# Patient Record
Sex: Male | Born: 1995 | Race: Black or African American | Hispanic: No | Marital: Single | Smoking: Never smoker
Health system: Southern US, Community
[De-identification: ages and names within clinical notes are randomized; demographics above are authoritative.]

---

## 2018-11-14 ENCOUNTER — Other Ambulatory Visit: Payer: Self-pay

## 2018-11-14 ENCOUNTER — Emergency Department (HOSPITAL_COMMUNITY)
Admission: EM | Admit: 2018-11-14 | Discharge: 2018-11-14 | Disposition: A | Discharge status: Home / Self Care | Payer: Self-pay | Attending: Emergency Medicine | Admitting: Emergency Medicine

## 2018-11-14 ENCOUNTER — Ambulatory Visit (HOSPITAL_COMMUNITY)
Admission: RE | Admit: 2018-11-14 | Discharge: 2018-11-14 | Disposition: A | Discharge status: Home / Self Care | Payer: Self-pay | Attending: Psychiatry | Admitting: Psychiatry

## 2018-11-14 ENCOUNTER — Emergency Department (HOSPITAL_COMMUNITY): Payer: Self-pay

## 2018-11-14 DIAGNOSIS — Z590 Homelessness: Secondary | ICD-10-CM | POA: Insufficient documentation

## 2018-11-14 DIAGNOSIS — R05 Cough: Secondary | ICD-10-CM | POA: Insufficient documentation

## 2018-11-14 DIAGNOSIS — F329 Major depressive disorder, single episode, unspecified: Secondary | ICD-10-CM | POA: Insufficient documentation

## 2018-11-14 DIAGNOSIS — F251 Schizoaffective disorder, depressive type: Secondary | ICD-10-CM | POA: Insufficient documentation

## 2018-11-14 DIAGNOSIS — R0602 Shortness of breath: Secondary | ICD-10-CM | POA: Insufficient documentation

## 2018-11-14 DIAGNOSIS — Z79899 Other long term (current) drug therapy: Secondary | ICD-10-CM | POA: Insufficient documentation

## 2018-11-14 DIAGNOSIS — R059 Cough, unspecified: Secondary | ICD-10-CM

## 2018-11-14 DIAGNOSIS — R45851 Suicidal ideations: Secondary | ICD-10-CM | POA: Insufficient documentation

## 2018-11-14 NOTE — H&P (Signed)
Behavioral Health Medical Screening Exam  Christian Bush is an 23 y.o. male. Pt presented to [facility] as a voluntary walk-in. He was seen by this provider and Dr Lucianne Muss.  He stated he is hearing voices but would not elaborate on what he is hearing. He stated he takes Seroquel but would not state who prescribes it for him. He also stated he lives with his cousin yet he appears to be homeless. He is asking for food and wants to sleep. Pt does not meet inpatient criteria. Pt was provided with outpatient resources for medication management and therapy and shelter resources. Pt is psychiatrically clear.   Total Time spent with patient: 30 minutes  Psychiatric Specialty Exam: Physical Exam  ROS  There were no vitals taken for this visit.There is no height or weight on file to calculate BMI.  General Appearance: Disheveled  Eye Contact:  Poor  Speech:  Clear and Coherent and Slow  Volume:  Decreased  Mood:  Depressed and Dysphoric  Affect:  Congruent and Depressed  Thought Process:  Coherent, Linear and Descriptions of Associations: Intact  Orientation:  Full (Time, Place, and Person)  Thought Content:  Logical  Suicidal Thoughts:  No  Homicidal Thoughts:  No  Memory:  Immediate;   Good Recent;   Fair Remote;   Fair  Judgement:  Fair  Insight:  Fair  Psychomotor Activity:  Normal  Concentration: Concentration: Good and Attention Span: Good  Recall:  Good  Fund of Knowledge:Good  Language: Good  Akathisia:  Negative  Handed:  Right  AIMS (if indicated):     Assets:  Communication Skills Physical Health Resilience  Sleep:       Musculoskeletal: Strength & Muscle Tone: within normal limits Gait & Station: normal Patient leans: N/A  There were no vitals taken for this visit.  Recommendations:  Based on my evaluation the patient does not appear to have an emergency medical condition.  Laveda Abbe, NP 11/14/2018, 4:20 PM

## 2018-11-14 NOTE — ED Provider Notes (Signed)
[facility]-EMERGENCY DEPT Provider Note   CSN: [id number] Arrival date & time: 11/14/18  2126    History   Chief Complaint Chief Complaint  Patient presents with  . Suicidal    HPI Christian Bush is a 23 y.o. male.     The history is provided by the patient.  Mental Health Problem  Presenting symptoms: suicidal thoughts   Patient accompanied by:  Caregiver Degree of incapacity (severity):  Mild Onset quality:  Gradual Timing:  Intermittent Progression:  Waxing and waning Chronicity:  Chronic Context: noncompliance   Relieved by:  Nothing Worsened by:  Nothing Associated symptoms: no abdominal pain, no chest pain and no poor judgment   Risk factors: hx of mental illness     No past medical history on file.  There are no active problems to display for this patient.        Home Medications    Prior to Admission medications   Not on File    Family History No family history on file.  Social History Social History   Tobacco Use  . Smoking status: Not on file  Substance Use Topics  . Alcohol use: Not on file  . Drug use: Not on file     Allergies   Patient has no known allergies.   Review of Systems Review of Systems  Constitutional: Positive for fever. Negative for chills.  HENT: Negative for ear pain and sore throat.   Eyes: Negative for pain and visual disturbance.  Respiratory: Positive for shortness of breath. Negative for cough.   Cardiovascular: Negative for chest pain and palpitations.  Gastrointestinal: Negative for abdominal pain and vomiting.  Genitourinary: Negative for dysuria and hematuria.  Musculoskeletal: Negative for arthralgias and back pain.  Skin: Negative for color change and rash.  Neurological: Negative for seizures and syncope.  Psychiatric/Behavioral: Positive for suicidal ideas.  All other systems reviewed and are negative.    Physical Exam Updated Vital Signs  ED Triage Vitals [11/14/18  2136]  Enc Vitals Group     BP (!) 146/84     Pulse Rate (!) 105     Resp 18     Temp 98.4 F (36.9 C)     Temp Source Oral     SpO2 98 %     Weight      Height      Head Circumference      Peak Flow      Pain Score      Pain Loc      Pain Edu?      Excl. in GC?     Physical Exam Vitals signs and nursing note reviewed.  Constitutional:      General: He is not in acute distress.    Appearance: He is well-developed. He is not ill-appearing.  HENT:     Head: Normocephalic and atraumatic.     Nose: Nose normal.     Mouth/Throat:     Mouth: Mucous membranes are moist.  Eyes:     Extraocular Movements: Extraocular movements intact.     Conjunctiva/sclera: Conjunctivae normal.     Pupils: Pupils are equal, round, and reactive to light.  Neck:     Musculoskeletal: Normal range of motion and neck supple.  Cardiovascular:     Rate and Rhythm: Normal rate and regular rhythm.     Pulses: Normal pulses.     Heart sounds: Normal heart sounds. No murmur.  Pulmonary:     Effort: Pulmonary effort  is normal. No respiratory distress.     Breath sounds: Normal breath sounds.  Abdominal:     General: There is no distension.     Palpations: Abdomen is soft.     Tenderness: There is no abdominal tenderness.  Musculoskeletal: Normal range of motion.  Skin:    General: Skin is warm and dry.     Capillary Refill: Capillary refill takes less than 2 seconds.  Neurological:     General: No focal deficit present.     Mental Status: He is alert.  Psychiatric:        Mood and Affect: Mood normal.      ED Treatments / Results  Labs (all labs ordered are listed, but only abnormal results are displayed) Labs Reviewed - No data to display  EKG None  Radiology Dg Chest Portable 1 View  Result Date: 11/14/2018 CLINICAL DATA:  Fever and shortness of breath EXAM: PORTABLE CHEST 1 VIEW COMPARISON:  None. FINDINGS: The heart size and mediastinal contours are within normal limits. Both  lungs are clear. The visualized skeletal structures are unremarkable. IMPRESSION: No active disease. Electronically Signed   By: Deatra Robinson M.D.   On: 11/14/2018 22:05    Procedures Procedures (including critical care time)  Medications Ordered in ED Medications - No data to display   Initial Impression / Assessment and Plan / ED Course  I have reviewed the triage vital signs and the nursing notes.  Pertinent labs & imaging results that were available during my care of the patient were reviewed by me and considered in my medical decision making (see chart for details).     [name] is a 23 year old male who presents to the ED with shortness of breath, fever.  Patient with unremarkable vitals.  No fever upon arrival.  Patient has had cough and fever supposedly.  Recently moved here from [place].  Patient currently homeless.  Patient states that he is having suicidal thoughts today and increase hallucinations.  Patient was actually seen by behavioral health just several hours prior to his arrival to the ED.  BH note states that patient can follow-up outpatient.  There did not appear to be any need for admission per their note.  Patient does not have a plan.  I believe his visit today primarily due to homelessness.  According to behavioral health note from 3 hours ago they state that he did not meet inpatient criteria.  He was given resources for outpatient follow-up and for homeless shelters. Called social work and they recommend, [facility] follow up. Patient with unremarkable CXR. Suspect patient was unable to get into a homeless shelter today and primary reason for his visit. Patient had food while in the, unfortunately have to d/c patient at this time. He understands resources to follow up with [facility] and [facility]. Patient discharged in good condition.   This chart was dictated using voice recognition software.  Despite best efforts to proofread,  errors can occur which can change the  documentation meaning.   Final Clinical Impressions(s) / ED Diagnoses   Final diagnoses:  Suicidal ideation  Cough    ED Discharge Orders    None       Virgina Norfolk, DO 11/14/18 2229

## 2018-11-14 NOTE — ED Triage Notes (Signed)
Pt is here after moving from [place], pt is currently homeless. Hx of asthma ans behavioral health hx. Just got discharged from [facility] 2 hours ago. Reports fever and dry cough. Afebrile here

## 2018-11-14 NOTE — ED Notes (Signed)
Pt given and verbalized understanding of d/c instructions and need for follow up with pcp and [facility]. Told to return if s/s worsen. No further distress or questions upon ambulation with security out of department. Pt refused to sign as he walked out and stated "Im' not signing a damn thing"

## 2018-11-14 NOTE — BH Assessment (Addendum)
Assessment Note  Christian Bush is an 23 y.o. male.  The pt came in due to having suicidal thoughts and visual and audial hallucinations. The pt denies having a suicidal plan.  He stated he is seeing and hearing spirits.  When asked what he is hearing, the pt stated he doesn't like what they are saying and wouldn't give details about what he is seeing and hearing. The pt is evasive.  He stated his birthday as March 30, 1996 and stated he is [age], which isn't mathematically possible.  He was asked this several times.  He denies having any identification.  He denies any past psychiatric history, but later stated he has taken seroquel in the past.  The pt stated he lives with a cousin and he couldn't provide the address of the cousin.  It is believed that the pt is homeless.  He denies self harm, legal issues, and history of abuse.  He stated he is sleeping and eating well.  The pt denies SA.  Pt is dressed in casual clohtes. He is alert and oriented x4. Pt speaks in a clear tone, at low volume and normal pace. Eye contact is fair. Pt's mood is irritated. Thought process is coherent and relevant.           Diagnosis: F25.1 Schizoaffective disorder, Depressive type  Past Medical History: No past medical history on file.   Family History: No family history on file.  Social History:  has no history on file for tobacco, alcohol, and drug.  Additional Social History:  Alcohol / Drug Use Pain Medications: See MAR Prescriptions: See MAR Over the Counter: See MAR History of alcohol / drug use?: No history of alcohol / drug abuse Longest period of sobriety (when/how long): NA  CIWA:   COWS:    Allergies: Allergies not on file  Home Medications: (Not in a hospital admission)   OB/GYN Status:  No LMP for male patient.  General Assessment Data [facility] TTS Assessment: In system Is this a Tele or Face-to-Face Assessment?: Face-to-Face Is this an Initial  Assessment or a Re-assessment for this encounter?: Initial Assessment Patient Accompanied by:: N/A Language Other than English: No Living Arrangements: Homeless/Shelter What gender do you identify as?: Male Marital status: Single Living Arrangements: Other (Comment)(homeless) Can pt return to current living arrangement?: Yes Admission Status: Voluntary Is patient capable of signing voluntary admission?: Yes Referral Source: Self/Family/Friend Insurance type: Self Pay     Crisis Care Plan Living Arrangements: Other (Comment)(homeless) Legal Guardian: Other:(Self) Name of Psychiatrist: none Name of Therapist: none  Education Status Is patient currently in school?: No Is the patient employed, unemployed or receiving disability?: Unemployed  Risk to self with the past 6 months Suicidal Ideation: Yes-Currently Present Has patient been a risk to self within the past 6 months prior to admission? : No Suicidal Intent: No Has patient had any suicidal intent within the past 6 months prior to admission? : No Is patient at risk for suicide?: No Suicidal Plan?: No Has patient had any suicidal plan within the past 6 months prior to admission? : No Access to Means: No What has been your use of drugs/alcohol within the last 12 months?: none Previous Attempts/Gestures: No How many times?: 0 Other Self Harm Risks: none Triggers for Past Attempts: None known Intentional Self Injurious Behavior: None Family Suicide History: No Recent stressful life event(s): Other (Comment)(pt denies any stressors) Persecutory voices/beliefs?: No Substance abuse history and/or treatment for substance abuse?:  No Suicide prevention information given to non-admitted patients: Yes  Risk to Others within the past 6 months Homicidal Ideation: No Does patient have any lifetime risk of violence toward others beyond the six months prior to admission? : No Thoughts of Harm to Others: No Current Homicidal Intent:  No Current Homicidal Plan: No Access to Homicidal Means: No Identified Victim: pt denies History of harm to others?: No Assessment of Violence: None Noted Violent Behavior Description: pt denies Does patient have access to weapons?: No Criminal Charges Pending?: No Does patient have a court date: No Is patient on probation?: No  Psychosis Hallucinations: None noted Delusions: None noted  Mental Status Report Appearance/Hygiene: Unremarkable Eye Contact: Fair Motor Activity: Freedom of movement Speech: Logical/coherent, Soft Level of Consciousness: Alert Mood: Depressed Affect: Depressed Anxiety Level: None Thought Processes: Thought Blocking Judgement: Impaired Orientation: Person, Place, Time, Situation Obsessive Compulsive Thoughts/Behaviors: None  Cognitive Functioning Concentration: Normal Memory: Recent Intact, Remote Intact Is patient IDD: No Insight: Fair Impulse Control: Poor Appetite: Good Have you had any weight changes? : No Change Sleep: No Change Total Hours of Sleep: 8 Vegetative Symptoms: None  ADLScreening [facility] Assessment Services) Patient's cognitive ability adequate to safely complete daily activities?: Yes Patient able to express need for assistance with ADLs?: Yes Independently performs ADLs?: Yes (appropriate for developmental age)  Prior Inpatient Therapy Prior Inpatient Therapy: No  Prior Outpatient Therapy Prior Outpatient Therapy: No Does patient have an ACCT team?: No Does patient have Intensive In-House Services?  : No Does patient have [facility] services? : No Does patient have [facility] services?: No  ADL Screening (condition at time of admission) Patient's cognitive ability adequate to safely complete daily activities?: Yes Patient able to express need for assistance with ADLs?: Yes Independently performs ADLs?: Yes (appropriate for developmental age)       Abuse/Neglect Assessment (Assessment to be complete while patient is  alone) Abuse/Neglect Assessment Can Be Completed: Yes Physical Abuse: Denies Verbal Abuse: Denies Sexual Abuse: Denies Exploitation of patient/patient's resources: Denies Self-Neglect: Denies Values / Beliefs Cultural Requests During Hospitalization: None Spiritual Requests During Hospitalization: None Consults Spiritual Care Consult Needed: No Social Work Consult Needed: No            Disposition:  NP Elta Guadeloupe recommends the pt be discharged and to follow up with [facility].    On Site Evaluation by:   Reviewed with Physician:    Ottis Stain 11/14/2018 4:15 PM

## 2018-11-14 NOTE — ED Notes (Signed)
Bed: AX09 Expected date:  Expected time:  Means of arrival:  Comments: EMS SOB, cough

## 2018-12-09 ENCOUNTER — Emergency Department (HOSPITAL_COMMUNITY)
Admission: EM | Admit: 2018-12-09 | Discharge: 2018-12-10 | Disposition: A | Discharge status: Other Acute Inpatient Hospital | Payer: Self-pay | Attending: Emergency Medicine | Admitting: Emergency Medicine

## 2018-12-09 ENCOUNTER — Encounter (HOSPITAL_COMMUNITY): Payer: Self-pay

## 2018-12-09 ENCOUNTER — Other Ambulatory Visit: Payer: Self-pay

## 2018-12-09 DIAGNOSIS — R45851 Suicidal ideations: Secondary | ICD-10-CM | POA: Insufficient documentation

## 2018-12-09 DIAGNOSIS — F319 Bipolar disorder, unspecified: Secondary | ICD-10-CM | POA: Insufficient documentation

## 2018-12-09 DIAGNOSIS — Z008 Encounter for other general examination: Secondary | ICD-10-CM | POA: Insufficient documentation

## 2018-12-09 LAB — COMPREHENSIVE METABOLIC PANEL
ALT: 42 U/L (ref 0–44)
AST: 25 U/L (ref 15–41)
Albumin: 4.4 g/dL (ref 3.5–5.0)
Alkaline Phosphatase: 78 U/L (ref 38–126)
Anion gap: 8 (ref 5–15)
BUN: 18 mg/dL (ref 6–20)
CO2: 22 mmol/L (ref 22–32)
Calcium: 9.1 mg/dL (ref 8.9–10.3)
Chloride: 106 mmol/L (ref 98–111)
Creatinine, Ser: 1.01 mg/dL (ref 0.61–1.24)
GFR calc Af Amer: >60 mL/min (ref >60)
GFR calc non Af Amer: >60 mL/min (ref >60)
Glucose, Bld: 90 mg/dL (ref 70–99)
Potassium: 3.7 mmol/L (ref 3.5–5.1)
Sodium: 136 mmol/L (ref 135–145)
Total Bilirubin: 0.3 mg/dL (ref 0.3–1.2)
Total Protein: 8.4 g/dL — ABNORMAL HIGH (ref 6.5–8.1)

## 2018-12-09 LAB — CBC
HCT: 50 % (ref 39.0–52.0)
Hemoglobin: 15.8 g/dL (ref 13.0–17.0)
MCH: 27.8 pg (ref 26.0–34.0)
MCHC: 31.6 g/dL (ref 30.0–36.0)
MCV: 88 fL (ref 80.0–100.0)
Platelets: 300 10*3/uL (ref 150–400)
RBC: 5.68 MIL/uL (ref 4.22–5.81)
RDW: 13.4 % (ref 11.5–15.5)
WBC: 12.9 10*3/uL — ABNORMAL HIGH (ref 4.0–10.5)
nRBC: 0 % (ref 0.0–0.2)

## 2018-12-09 LAB — ETHANOL: Alcohol, Ethyl (B): <10 mg/dL (ref <10)

## 2018-12-09 LAB — SALICYLATE LEVEL: Salicylate Lvl: <7 mg/dL (ref 2.8–30.0)

## 2018-12-09 LAB — ACETAMINOPHEN LEVEL: Acetaminophen (Tylenol), Serum: <10 ug/mL — ABNORMAL LOW (ref 10–30)

## 2018-12-09 MED ORDER — NICOTINE 21 MG/24HR TD PT24: 21.0000 mg | MEDICATED_PATCH | Freq: Every day | TRANSDERMAL | Status: DC

## 2018-12-09 MED ORDER — ACETAMINOPHEN 325 MG PO TABS: 650.0000 mg | ORAL_TABLET | ORAL | Status: DC | PRN

## 2018-12-09 MED ORDER — ONDANSETRON HCL 4 MG PO TABS: 4.0000 mg | ORAL_TABLET | Freq: Three times a day (TID) | ORAL | Status: DC | PRN

## 2018-12-09 MED ORDER — ALUM & MAG HYDROXIDE-SIMETH 200-200-20 MG/5ML PO SUSP: 30.0000 mL | Freq: Four times a day (QID) | ORAL | Status: DC | PRN

## 2018-12-09 NOTE — ED Notes (Signed)
Patient dressed out in purple scrubs 

## 2018-12-09 NOTE — ED Provider Notes (Signed)
[facility]-EMERGENCY DEPT Provider Note   CSN: [id number] Arrival date & time: 12/09/18  1814  History   Chief Complaint Chief Complaint  Patient presents with  . Suicidal   HPI Christian Bush is a 23 y.o. male with past medical history who presents for evaluation suicide ideation.  Patient states he is suicidal plans to slit his wrists or jump off a bridge.  Also admits to audio hallucinations telling him to harm himself. Has any alcohol or illicit drug use.  Patient is unwilling to tell me last time he was inpatient in the hospital however takes me that is been inpatient for suicidal plans previously.  Denies any increased stressors.  He did recently moved from [place].  Currently staying with his cousin.  He is not taking any medications, previously prescribed Seroquel.  Denies fever, chills, headache, nausea, vomiting, neck pain, neck stiffness, chest pain, shortness of breath, abdominal pain, ,dysuria.  Patient unwilling to provide any additional information.  History obtained from patient and past medical history.  No interpreter was used.     HPI  History reviewed. No pertinent past medical history.  There are no active problems to display for this patient.   History reviewed. No pertinent surgical history.      Home Medications    Prior to Admission medications   Not on File    Family History No family history on file.  Social History Social History   Tobacco Use  . Smoking status: Never Smoker  . Smokeless tobacco: Never Used  Substance Use Topics  . Alcohol use: Not on file  . Drug use: Not on file     Allergies   Patient has no known allergies.   Review of Systems Review of Systems  Constitutional: Negative.   HENT: Negative.   Respiratory: Negative.   Cardiovascular: Negative.   Genitourinary: Negative.   Musculoskeletal: Negative.   Skin: Negative.   Neurological: Negative.   Psychiatric/Behavioral: Positive for  hallucinations and suicidal ideas. Negative for agitation, confusion, self-injury and sleep disturbance. The patient is not nervous/anxious and is not hyperactive.   All other systems reviewed and are negative.  Physical Exam Updated Vital Signs BP (!) 139/96 (BP Location: Left Arm)   Pulse 67   Temp 98 F (36.7 C) (Oral)   Resp 18   SpO2 98%   Physical Exam Vitals signs and nursing note reviewed.  Constitutional:      General: He is not in acute distress.    Appearance: He is well-developed. He is not diaphoretic.  HENT:     Head: Atraumatic.  Eyes:     Pupils: Pupils are equal, round, and reactive to light.  Neck:     Musculoskeletal: Normal range of motion and neck supple.  Cardiovascular:     Rate and Rhythm: Normal rate and regular rhythm.     Pulses: Normal pulses.     Heart sounds: Normal heart sounds.  Pulmonary:     Effort: Pulmonary effort is normal. No respiratory distress.     Breath sounds: Normal breath sounds. No stridor. No wheezing, rhonchi or rales.  Abdominal:     General: Bowel sounds are normal. There is no distension.     Palpations: Abdomen is soft.     Tenderness: There is no abdominal tenderness. There is no guarding or rebound.  Musculoskeletal: Normal range of motion.  Skin:    General: Skin is warm and dry.  Neurological:     Mental Status: He is  alert.  Psychiatric:        Attention and Perception: He is attentive. He perceives auditory hallucinations. He does not perceive visual hallucinations.        Mood and Affect: Mood is depressed. Affect is flat.        Speech: Speech normal.        Behavior: Behavior is withdrawn.        Thought Content: Thought content is not paranoid or delusional. Thought content includes suicidal ideation. Thought content does not include homicidal ideation. Thought content includes suicidal plan. Thought content does not include homicidal plan.    ED Treatments / Results  Labs (all labs ordered are listed, but  only abnormal results are displayed) Labs Reviewed  COMPREHENSIVE METABOLIC PANEL - Abnormal; Notable for the following components:      Result Value   Total Protein 8.4 (*)    All other components within normal limits  ACETAMINOPHEN LEVEL - Abnormal; Notable for the following components:   Acetaminophen (Tylenol), Serum <10 (*)    All other components within normal limits  CBC - Abnormal; Notable for the following components:   WBC 12.9 (*)    All other components within normal limits  ETHANOL  SALICYLATE LEVEL  RAPID URINE DRUG SCREEN, HOSP PERFORMED    EKG None  Radiology No results found.  Procedures Procedures (including critical care time)  Medications Ordered in ED Medications  acetaminophen (TYLENOL) tablet 650 mg (has no administration in time range)  alum & mag hydroxide-simeth (MAALOX/MYLANTA) 200-200-20 MG/5ML suspension 30 mL (has no administration in time range)  nicotine (NICODERM CQ - dosed in mg/24 hours) patch 21 mg (21 mg Transdermal Refused 12/09/18 2125)  ondansetron (ZOFRAN) tablet 4 mg (has no administration in time range)     Initial Impression / Assessment and Plan / ED Course  I have reviewed the triage vital signs and the nursing notes.  Pertinent labs & imaging results that were available during my care of the patient were reviewed by me and considered in my medical decision making (see chart for details).  23 year old male appears otherwise well presents for evaluation of SI with plan.  Has been seen previously for this issue. Patient with increased depression.  Plan to slit his wrist or jump off a bridge. Also admits to auditory hallucinations. Recently moved here from [place]. He is current living with his cousin.  Not been taking his Seroquel.  Patient unwilling to provide additional information.  Denies headache, vision changes, neck pain, neck stiffness, chest pain, shortness of breath, abdominal pain, diarrhea dysuria.  Tolerating p.o.  intake at home without difficulty.  Heart and lungs clear.  Moves all 4 extremities without difficulty.  Abdomen soft, nontender without rebound or guarding.  Labs received from triage.  CBC with mild leukocytosis at 12.9, metabolic panel without electrolyte, renal or liver abnormality, salicylate, acetaminophen, alcohol level negative.  UDS pending.  Patient is mildly hypertensive. He is unsure if he has history of this since he does not follow with a PCP. He denies any systemic symptoms, no headache, chest pain, nausea, vomiting, unilateral weakness, chest pain, shortness of breath, abdominal pain.  Low suspicion for hypertensive urgency or hypertensive emergency.  Patient medically cleared, UDS pending however will not affect disposition.  Will consult TTS.  Patient is not under IVC at this time.     Final Clinical Impressions(s) / ED Diagnoses   Final diagnoses:  Suicidal ideation    ED Discharge Orders    None  Jaine Estabrooks A, PA-C 12/10/18 0057    Dorie Rank, MD 12/10/18 [phone]

## 2018-12-09 NOTE — ED Triage Notes (Signed)
Patient c/o SI, auditory and visual hallucinations X2 days.   Patient states he has a plan to "cut" himself.     Ambulatory in triage.

## 2018-12-09 NOTE — ED Notes (Signed)
Pt changed into scrubs and dinner tray given.  Pt is very depressed .  Pt contracts for safety at this time.

## 2018-12-09 NOTE — ED Notes (Signed)
Bed: WA33 Expected date:  Expected time:  Means of arrival:  Comments: 

## 2018-12-09 NOTE — ED Notes (Signed)
Pt alert and oriented. Pt c/o of si and avh . Pt reports the voices are telling him to harm himself. Pt reports a plan to cut his wrist. Pt calm and cooperative. Pt safe will continue to monitor.

## 2018-12-10 ENCOUNTER — Other Ambulatory Visit: Payer: Self-pay

## 2018-12-10 ENCOUNTER — Encounter (HOSPITAL_COMMUNITY): Payer: Self-pay

## 2018-12-10 ENCOUNTER — Encounter: Payer: Self-pay | Admitting: Nurse Practitioner

## 2018-12-10 ENCOUNTER — Ambulatory Visit (HOSPITAL_COMMUNITY)
Admission: RE | Admit: 2018-12-10 | Discharge: 2018-12-10 | Disposition: A | Discharge status: Home / Self Care | Payer: Self-pay | Attending: Psychiatry | Admitting: Psychiatry

## 2018-12-10 ENCOUNTER — Emergency Department (HOSPITAL_COMMUNITY)
Admission: EM | Admit: 2018-12-10 | Discharge: 2018-12-10 | Disposition: A | Discharge status: Home / Self Care | Payer: Self-pay | Attending: Emergency Medicine | Admitting: Emergency Medicine

## 2018-12-10 ENCOUNTER — Emergency Department (HOSPITAL_COMMUNITY): Admission: EM | Admit: 2018-12-10 | Discharge: 2018-12-10 | Discharge status: Absent without leave | Payer: Self-pay

## 2018-12-10 ENCOUNTER — Observation Stay (HOSPITAL_COMMUNITY)
Admission: RE | Admit: 2018-12-10 | Discharge: 2018-12-11 | Disposition: A | Discharge status: Home / Self Care | Payer: Federal, State, Local not specified - Other | Attending: Psychiatry | Admitting: Psychiatry

## 2018-12-10 DIAGNOSIS — Z590 Homelessness: Secondary | ICD-10-CM | POA: Insufficient documentation

## 2018-12-10 DIAGNOSIS — F259 Schizoaffective disorder, unspecified: Secondary | ICD-10-CM | POA: Diagnosis not present

## 2018-12-10 DIAGNOSIS — F4325 Adjustment disorder with mixed disturbance of emotions and conduct: Secondary | ICD-10-CM | POA: Diagnosis not present

## 2018-12-10 DIAGNOSIS — R45851 Suicidal ideations: Secondary | ICD-10-CM | POA: Insufficient documentation

## 2018-12-10 DIAGNOSIS — Z008 Encounter for other general examination: Secondary | ICD-10-CM | POA: Insufficient documentation

## 2018-12-10 DIAGNOSIS — R440 Auditory hallucinations: Secondary | ICD-10-CM

## 2018-12-10 DIAGNOSIS — Z9119 Patient's noncompliance with other medical treatment and regimen: Secondary | ICD-10-CM | POA: Diagnosis not present

## 2018-12-10 DIAGNOSIS — R4689 Other symptoms and signs involving appearance and behavior: Secondary | ICD-10-CM | POA: Insufficient documentation

## 2018-12-10 LAB — RAPID URINE DRUG SCREEN, HOSP PERFORMED
Amphetamines: NOT DETECTED
Barbiturates: NOT DETECTED
Benzodiazepines: NOT DETECTED
Cocaine: NOT DETECTED
Opiates: NOT DETECTED
Tetrahydrocannabinol: NOT DETECTED

## 2018-12-10 MED ORDER — QUETIAPINE FUMARATE 300 MG PO TABS
300.0000 mg | ORAL_TABLET | Freq: Every day | ORAL | Status: DC
  Administered 2018-12-11: 300 mg via ORAL
  Filled 2018-12-10: qty 1

## 2018-12-10 MED ORDER — ACETAMINOPHEN 325 MG PO TABS: 650.0000 mg | ORAL_TABLET | Freq: Four times a day (QID) | ORAL | Status: DC | PRN

## 2018-12-10 MED ORDER — HYDROXYZINE HCL 25 MG PO TABS: 25.0000 mg | ORAL_TABLET | Freq: Three times a day (TID) | ORAL | Status: DC | PRN

## 2018-12-10 MED ORDER — MAGNESIUM HYDROXIDE 400 MG/5ML PO SUSP: 30.0000 mL | Freq: Every day | ORAL | Status: DC | PRN

## 2018-12-10 MED ORDER — ALUM & MAG HYDROXIDE-SIMETH 200-200-20 MG/5ML PO SUSP: 30.0000 mL | ORAL | Status: DC | PRN

## 2018-12-10 NOTE — H&P (Addendum)
Behavioral Health Medical Screening Exam  Christian [facility] is an 23 y.o. male patient who presents as a walk in. Assessment unchanged from earlier. See below.    [facility] is an 23 y.o. male patient who presents as a walk in from [facility]. Patient reports that he is hearing voices "they say they want me to like cut my wrists and stuff like that." States that he started hearing the voices three to four days ago, which is not consistent with chart review. No indication that he is responding to internal stimuli. Denies an actual suicidal plan or intent. Per TTS assessment patient denied SI plan.   Patient adamantly denies that he has a psychiatric history, although a review of this chart indicates that he was seen here as a walk-in in Nov 14, 2018 and at that time indicated he was taking Seroquel. Patient has a history of giving fictitious names/birthdays. Chart is in process of being merged. Patient also denies that he was discharged from [facility] OBS unit on 12/09/2018.  States that he does not know his social security number.   Total Time spent with patient: 20 minutes  Psychiatric Specialty Exam: Physical Exam  Constitutional: He is oriented to person, place, and time. He appears well-developed and well-nourished. No distress.  HENT:  Head: Normocephalic and atraumatic.  Right Ear: External ear normal.  Left Ear: External ear normal.  Eyes: Pupils are equal, round, and reactive to light. Conjunctivae are normal. Right eye exhibits no discharge. Left eye exhibits no discharge. No scleral icterus.  Respiratory: Effort normal. No respiratory distress.  Musculoskeletal: Normal range of motion.  Neurological: He is alert and oriented to person, place, and time.  Skin: He is not diaphoretic.  Psychiatric: He is not withdrawn and not actively hallucinating. Thought content is not paranoid and not delusional. He expresses no homicidal and no suicidal ideation.    Review of Systems  Constitutional:  Negative for chills, diaphoresis, fever, malaise/fatigue and weight loss.  Respiratory: Negative for cough and shortness of breath.   Cardiovascular: Negative for chest pain.  Gastrointestinal: Negative for diarrhea, nausea and vomiting.  Psychiatric/Behavioral: Positive for depression, hallucinations and suicidal ideas. Negative for memory loss and substance abuse. The patient is nervous/anxious and has insomnia.     Blood pressure 139/71, pulse 87, temperature 98.3 F (36.8 C), temperature source Oral, resp. rate 16, SpO2 98 %.There is no height or weight on file to calculate BMI.  General Appearance: Casual and Fairly Groomed  Eye Contact:  Good  Speech:  Clear and Coherent and Normal Rate  Volume:  Normal  Mood:  Depressed  Affect:  Congruent and Depressed  Thought Process:  Coherent and Descriptions of Associations: Intact  Orientation:  Full (Time, Place, and Person)  Thought Content:  Hallucinations: Auditory Reprots auditory hallucinations.  Suicidal Thoughts:  Yes.  without intent/plan  Homicidal Thoughts:  No  Memory:  Immediate;   Fair Recent;   Fair  Judgement:  Fair  Insight:  Fair  Psychomotor Activity:  Normal  Concentration: Concentration: Good and Attention Span: Good  Recall:  [place] of Knowledge:Good  Language: Good  Akathisia:  Negative  Handed:  Right  AIMS (if indicated):     Assets:  Communication Skills Leisure Time Physical Health  Sleep:       Musculoskeletal: Strength & Muscle Tone: within normal limits Gait & Station: normal Patient leans: Front

## 2018-12-10 NOTE — H&P (Signed)
Behavioral Health Medical Screening Exam  [facility] is an 23 y.o. male.  Total Time spent with patient: 30 minutes  Psychiatric Specialty Exam: Physical Exam  Constitutional: He is oriented to person, place, and time. He appears well-developed and well-nourished. No distress.  HENT:  Head: Normocephalic and atraumatic.  Right Ear: External ear normal.  Left Ear: External ear normal.  Eyes: Pupils are equal, round, and reactive to light. Conjunctivae are normal. Right eye exhibits no discharge. Left eye exhibits no discharge. No scleral icterus.  Respiratory: Effort normal. No respiratory distress.  Musculoskeletal: Normal range of motion.  Neurological: He is alert and oriented to person, place, and time.  Skin: He is not diaphoretic.  Psychiatric: His speech is normal. He is not withdrawn and not actively hallucinating. Thought content is not paranoid and not delusional. He expresses impulsivity and inappropriate judgment. He exhibits a depressed mood. He expresses homicidal and suicidal ideation. He expresses homicidal plans. He expresses no suicidal plans.    Review of Systems  Constitutional: Negative for chills, diaphoresis, fever, malaise/fatigue and weight loss.  Respiratory: Negative for cough and shortness of breath.   Cardiovascular: Negative for chest pain.  Gastrointestinal: Negative for diarrhea, nausea and vomiting.  Psychiatric/Behavioral: Positive for depression, hallucinations and suicidal ideas. Negative for memory loss and substance abuse. The patient is nervous/anxious and has insomnia.     Blood pressure 110/87, pulse 78, temperature 98.1 F (36.7 C), temperature source Oral, resp. rate 18, SpO2 98 %.There is no height or weight on file to calculate BMI.  General Appearance: Casual and Fairly Groomed  Eye Contact:  Fair  Speech:  Clear and Coherent and Normal Rate  Volume:  Normal  Mood:  Depressed  Affect:  Congruent and Depressed  Thought Process:   Coherent and Descriptions of Associations: Intact  Orientation:  Full (Time, Place, and Person)  Thought Content:  Logical and Hallucinations: Auditory  Suicidal Thoughts:  Yes.  without intent/plan  Homicidal Thoughts:  Yes.  with intent/plan  Memory:  Immediate;   Fair Recent;   Fair  Judgement:  Fair  Insight:  Fair  Psychomotor Activity:  Normal  Concentration: Concentration: Fair and Attention Span: Fair  Recall:  Good  Fund of Knowledge:Good  Language: Negative  Akathisia:  Negative  Handed:  Right  AIMS (if indicated):     Assets:  Communication Skills Leisure Time Physical Health  Sleep:       Musculoskeletal: Strength & Muscle Tone: within normal limits Gait & Station: normal Patient leans: Front  Blood pressure 110/87, pulse 78, temperature 98.1 F (36.7 C), temperature source Oral, resp. rate 18, SpO2 98 %.  Recommendations:  Based on my evaluation the patient does not appear to have an emergency medical condition.  Rozetta Nunnery, NP 12/10/2018, 11:32 PM

## 2018-12-10 NOTE — BH Assessment (Addendum)
Tele Assessment Note   Patient Name: [facility] MRN: [id number] Referring Physician: Dr. Dorie Rank, MD Location of Patient: [facility] Location of Provider: [facility]  [facility] is a 23 y.o. male who was [facility] to be assessed in-person by Lindon Romp, NP due to conflicting information he provided his nurse and the clinician who conducted his [facility] Assessment. Pt told his nurse that he had a plan to cut his wrists and later denied he had a plan, or weapons/guns, to this clinician when these questions were posed. During the initial assessment at [facility], pt stated he was experiencing AH that were telling him to kill himself and that he had never experienced AH before or SI before. Pt denied having a plan, that he had ever attempted to kill himself before, or that he had ever been hospitalized for MH reasons in the past. Pt denied VH. Pt denied HI. Pt then stated that he was experiencing VH. Clinician requested pt share what he was experiencing and pt stated "spirits." Clinician inquired as to how pt knew that it was spirits that he was seeing and pt stated "that's what they seem like." Pt denied NSSIB, access to guns and weapons, involvement with the law, and use of substances (pt's UDA came back clean/negative from the use of EtOH or substances).   Pt shares he is currently homeless. Records show he was d/c from [name] this morning after an overnight stay under a different name, which was documented by [name], DO, who knows pt under both of the names he has used. When pt was asked about his stay at [facility] last night, pt stated that he doesn't remember. Clinician read the notes from pt's assessment last night and he stated he was going to kill himself in a different manner, which was via walking into traffic.  Pt states he has no family or close friends for clinician to contact for collateral.  Pt is  oriented x4. His recent and remote memory is intact with the exception of the thought-blocking of the information regarding his multiple records. Pt was overall cooperative, though blunt, throughout the assessment process. Pt's insight, judgement, and impulse control is impaired at this time.   Diagnosis: F31.9, Bipolar I disorder, Current or most recent episode unspecified   Past Medical History: History reviewed. No pertinent past medical history.  History reviewed. No pertinent surgical history.  Family History: No family history on file.  Social History:  reports that he has never smoked. He has never used smokeless tobacco. No history on file for alcohol and drug.  Additional Social History:  Alcohol / Drug Use Pain Medications: Please see MAR Prescriptions: Please see MAR Over the Counter: Please see MAR History of alcohol / drug use?: No history of alcohol / drug abuse Longest period of sobriety (when/how long): Pt denies use of substances  CIWA: CIWA-Ar BP: (!) 139/96 Pulse Rate: 67 COWS:    Allergies: No Known Allergies  Home Medications: (Not in a [facility] admission)   OB/GYN Status:  No LMP for male patient.  General Assessment Data Assessment unable to be completed: Yes Reason for not completing assessment: Multiple assessments ordered simultaneously Location of Assessment: [facility] TTS Assessment: In system Is this a Tele or Face-to-Face Assessment?: Tele Assessment Is this an Initial Assessment or a Re-assessment for this encounter?: Initial Assessment Patient Accompanied by:: N/A Language Other than English: No Living  Arrangements: Homeless/Shelter What gender do you identify as?: Male Marital status: Single Maiden name: Hallam Pregnancy Status: No Living Arrangements: Alone Can pt return to current living arrangement?: Yes Admission Status: Voluntary Is patient capable of signing voluntary admission?: Yes Referral Source: Self/Family/Friend Insurance  type: None     Crisis Care Plan Living Arrangements: Alone Legal Guardian: Other:(Self) Name of Psychiatrist: None Name of Therapist: None  Education Status Is patient currently in school?: No Is the patient employed, unemployed or receiving disability?: Unemployed  Risk to self with the past 6 months Suicidal Ideation: Yes-Currently Present Has patient been a risk to self within the past 6 months prior to admission? : Yes Suicidal Intent: Yes-Currently Present Has patient had any suicidal intent within the past 6 months prior to admission? : No Is patient at risk for suicide?: No Suicidal Plan?: Yes-Currently Present Has patient had any suicidal plan within the past 6 months prior to admission? : No Specify Current Suicidal Plan: Pt told nurse he plans to cut his wrist; told clinician he has no plan Access to Means: No What has been your use of drugs/alcohol within the last 12 months?: Pt denies SA Previous Attempts/Gestures: No How many times?: 0 Other Self Harm Risks: Pt is homeless Triggers for Past Attempts: None known Intentional Self Injurious Behavior: None Family Suicide History: No Recent stressful life event(s): Other (Comment)(Pt just moved to [name]) Persecutory voices/beliefs?: Yes Depression: Yes Depression Symptoms: Despondent, Feeling worthless/self pity Substance abuse history and/or treatment for substance abuse?: No Suicide prevention information given to non-admitted patients: Not applicable  Risk to Others within the past 6 months Homicidal Ideation: No Does patient have any lifetime risk of violence toward others beyond the six months prior to admission? : No Thoughts of Harm to Others: No Current Homicidal Intent: No Current Homicidal Plan: No Access to Homicidal Means: No Identified Victim: None noted History of harm to others?: No Assessment of Violence: On admission Violent Behavior Description: None noted Does patient have access to weapons?:  No(Pt denies access to weapons/guns) Criminal Charges Pending?: No Does patient have a court date: No Is patient on probation?: No  Psychosis Hallucinations: Auditory, Visual Delusions: None noted  Mental Status Report Appearance/Hygiene: In scrubs Eye Contact: Fair Motor Activity: Freedom of movement Speech: Soft, Slow Level of Consciousness: Quiet/awake Mood: Sad Affect: Sad Anxiety Level: None Thought Processes: Coherent Judgement: Partial Orientation: Person, Place, Time, Situation Obsessive Compulsive Thoughts/Behaviors: None  Cognitive Functioning Concentration: Normal Memory: Recent Intact, Remote Intact Is patient IDD: No Insight: Fair Impulse Control: Fair Appetite: Good Have you had any weight changes? : No Change Sleep: No Change Total Hours of Sleep: 6 Vegetative Symptoms: None  ADLScreening [facility] Assessment Services) Patient's cognitive ability adequate to safely complete daily activities?: Yes Patient able to express need for assistance with ADLs?: Yes Independently performs ADLs?: Yes (appropriate for developmental age)  Prior Inpatient Therapy Prior Inpatient Therapy: No  Prior Outpatient Therapy Prior Outpatient Therapy: No Does patient have an ACCT team?: No Does patient have Intensive In-House Services?  : No Does patient have [facility] services? : No Does patient have P4CC services?: No  ADL Screening (condition at time of admission) Patient's cognitive ability adequate to safely complete daily activities?: Yes Is the patient deaf or have difficulty hearing?: No Does the patient have difficulty seeing, even when wearing glasses/contacts?: No Does the patient have difficulty concentrating, remembering, or making decisions?: No Patient able to express need for assistance with ADLs?: Yes Does the patient have  difficulty dressing or bathing?: No Independently performs ADLs?: Yes (appropriate for developmental age) Does the patient have difficulty  walking or climbing stairs?: No Weakness of Legs: None Weakness of Arms/Hands: None  Home Assistive Devices/Equipment Home Assistive Devices/Equipment: None  Therapy Consults (therapy consults require a physician order) PT Evaluation Needed: No OT Evalulation Needed: No SLP Evaluation Needed: No Abuse/Neglect Assessment (Assessment to be complete while patient is alone) Abuse/Neglect Assessment Can Be Completed: Yes Physical Abuse: Denies Verbal Abuse: Denies Sexual Abuse: Denies Exploitation of patient/patient's resources: Denies Self-Neglect: Denies Values / Beliefs Cultural Requests During Hospitalization: None Spiritual Requests During Hospitalization: None Consults Spiritual Care Consult Needed: No Social Work Consult Needed: No Regulatory affairs officer (For Healthcare) Does Patient Have a Medical Advance Directive?: No Would patient like information on creating a medical advance directive?: No - Patient declined        Disposition: Lindon Romp, NP, reviewed pt's chart and information and met with pt after pt was transported to [facility]. Jason determined pt does not meet criteria for inpatient services  Pt was provided information for free/sliding-scale services within [facility].   Disposition Initial Assessment Completed for this Encounter: Yes Patient referred to: Other (Comment)(Pt's disposition will be determined upon arrival to [facility])  This service was provided via telemedicine using a 2-way, interactive audio and video technology.  Names of all persons participating in this telemedicine service and their role in this encounter. Name: Mills Koller Role: Patient  Name: Lindon Romp Role: Nurse Practitioner  Name: Windell Hummingbird Role: Clinician    Dannielle Burn 12/10/2018 1:26 AM

## 2018-12-10 NOTE — Discharge Instructions (Signed)
Please go directly to [facility] in [place] for further management.  Return to the ER for new or worsening symptoms or any other concerns.

## 2018-12-10 NOTE — ED Notes (Signed)
Pt left before discharge paperwork provided

## 2018-12-10 NOTE — ED Notes (Signed)
Pt transferred to [facility] obs unit. Pt transported via [facility] transportation.

## 2018-12-10 NOTE — ED Provider Notes (Signed)
[facility]  CSN: [id number] Arrival date & time: 12/10/18 [id number]  Chief Complaint(s) Suicidal  HPI [facility] is a 23 y.o. male returns to the emergency department after being discharged from behavioral health for suicidal ideation.  Behavioral health recommended outpatient management and felt he did not meet inpatient criteria.  Patient reports that they told him that they did not have any beds so he called EMS to be transferred to another hospital.  EMS declined and told him to return to this emergency department. Denies any acute changes.  HPI  Past Medical History History reviewed. No pertinent past medical history. There are no active problems to display for this patient.  Home Medication(s) Prior to Admission medications   Not on File                                                                                                                                    Past Surgical History History reviewed. No pertinent surgical history. Family History No family history on file.  Social History Social History   Tobacco Use  . Smoking status: Never Smoker  . Smokeless tobacco: Never Used  Substance Use Topics  . Alcohol use: Not Currently  . Drug use: Not Currently   Allergies Patient has no known allergies.  Review of Systems Review of Systems All other systems are reviewed and are negative for acute change except as noted in the HPI  Physical Exam Vital Signs  I have reviewed the triage vital signs BP (!) 149/106 (BP Location: Right Arm)   Pulse 72   Temp 98.5 F (36.9 C) (Oral)   Resp 16   Ht 5\' 8"  (1.727 m)   Wt 113.4 kg   SpO2 98%   BMI 38.01 kg/m   Physical Exam Vitals signs reviewed.  Constitutional:      General: He is not in acute distress.    Appearance: He is well-developed. He is not diaphoretic.     Comments: Disheveled  HENT:     Head: Normocephalic and atraumatic.     Jaw: No trismus.      Right Ear: External ear normal.     Left Ear: External ear normal.     Nose: Nose normal.  Eyes:     General: No scleral icterus.    Conjunctiva/sclera: Conjunctivae normal.  Neck:     Musculoskeletal: Normal range of motion.     Trachea: Phonation normal.  Cardiovascular:     Rate and Rhythm: Normal rate and regular rhythm.  Pulmonary:     Effort: Pulmonary effort is normal. No respiratory distress.     Breath sounds: No stridor.  Abdominal:     General: There is no distension.  Musculoskeletal: Normal range of motion.  Neurological:     Mental Status: He is alert and oriented to person, place, and time.  Psychiatric:  Behavior: Behavior normal.     ED Results and Treatments Labs (all labs ordered are listed, but only abnormal results are displayed) Labs Reviewed - No data to display                                                                                                                       EKG  EKG Interpretation  Date/Time:    Ventricular Rate:    PR Interval:    QRS Duration:   QT Interval:    QTC Calculation:   R Axis:     Text Interpretation:        Radiology No results found. Pertinent labs & imaging results that were available during my care of the patient were reviewed by me and considered in my medical decision making (see chart for details).  Medications Ordered in ED Medications - No data to display                                                                                                                                  Procedures Procedures  (including critical care time)  Medical Decision Making / ED Course I have reviewed the nursing notes for this encounter and the patient's prior records (if available in EHR or on provided paperwork).    [facility] Recommendations:  Based on my evaluation the patient does not appear to have an emergency medical condition.  No evidence of imminent risk to self or others at present.    Patient does not meet criteria for psychiatric inpatient admission. Supportive therapy provided about ongoing stressors. Discussed crisis plan, support from social network, calling 911, coming to the Emergency Department, and calling Suicide Hotline. Provided with outpatient resources.   The patient appears reasonably screened and/or stabilized for discharge and I doubt any other medical condition or other [facility] requiring further screening, evaluation, or treatment in the ED at this time prior to discharge.   Final Clinical Impression(s) / ED Diagnoses Final diagnoses:  Suicidal ideation   Disposition: Discharge  Condition: Good   ED Discharge Orders    None        This chart was dictated using voice recognition software.  Despite best efforts to proofread,  errors can occur which can change the documentation meaning.   Fatima Blank, MD 12/10/18 [phone]

## 2018-12-10 NOTE — ED Notes (Signed)
Bed: WTR5 Expected date:  Expected time:  Means of arrival:  Comments: 

## 2018-12-10 NOTE — ED Triage Notes (Signed)
Pt recently discharged after not meeting in-patient criteria to [facility], pt stating he is suicidal and having visual hallucinations.

## 2018-12-10 NOTE — ED Provider Notes (Signed)
[facility] Provider Note   CSN: [id number] Arrival date & time: 12/10/18  0827     History   Chief Complaint Chief Complaint  Christian Bush presents with  . Suicidal    HPI Christian Bush is a 23 y.o. male who [facility] the ER [facility] & auditory hallucinations. Christian Bush reports SI w/ plan to cut himself & auditory hallucinations w/ voices telling him to harm himself. No alleviating/aggravating factors. Denies any specific triggers to this. Denies HI or visual hallucinations. Denies attempts to harm himself. No prior suicide attempts.      HPI  History reviewed. No pertinent past medical history.  There are no active problems to display for this Christian Bush.   History reviewed. No pertinent surgical history.      Home Medications    Prior to Admission medications   Not on File    Family History No family history on file.  Social History Social History   Tobacco Use  . Smoking status: Never Smoker  . Smokeless tobacco: Never Used  Substance Use Topics  . Alcohol use: Not Currently  . Drug use: Not Currently     Allergies   Christian Bush has no known allergies.   Review of Systems Review of Systems  Constitutional: Negative for fever.  Respiratory: Negative for shortness of breath.   Cardiovascular: Negative for chest pain.  Gastrointestinal: Negative for anal bleeding.  Psychiatric/Behavioral: Positive for hallucinations and suicidal ideas. Negative for self-injury.  All other systems reviewed and are negative.    Physical Exam Updated Vital Signs BP 123/74 (BP Location: Right Arm)   Pulse 64   Temp 98.2 F (36.8 C) (Oral)   Resp 18   Ht 5\' 8"  (1.727 m)   Wt 113.4 kg   SpO2 98%   BMI 38.01 kg/m   Physical Exam Vitals signs and nursing note reviewed.  Constitutional:      General: Christian Bush is not in acute distress.    Appearance: Christian Bush is well-developed. Christian Bush is not toxic-appearing.  HENT:     Head:  Normocephalic and atraumatic.  Eyes:     General:        Right eye: No discharge.        Left eye: No discharge.     Conjunctiva/sclera: Conjunctivae normal.  Neck:     Musculoskeletal: Neck supple.  Cardiovascular:     Rate and Rhythm: Normal rate and regular rhythm.  Pulmonary:     Effort: Pulmonary effort is normal. No respiratory distress.     Breath sounds: Normal breath sounds. No wheezing, rhonchi or rales.  Abdominal:     General: There is no distension.     Palpations: Abdomen is soft.     Tenderness: There is no abdominal tenderness.  Skin:    General: Skin is warm and dry.     Findings: No rash.  Neurological:     Mental Status: Christian Bush is alert.     Comments: Clear speech.   Psychiatric:        Behavior: Behavior is cooperative.     Comments: Does not appear to be responding to internal stimuli.     ED Treatments / Results  Labs (all labs ordered are listed, but only abnormal results are displayed) Labs Reviewed - No data to display  EKG    Radiology No results found.  Procedures Procedures (including critical care time)  Medications Ordered in ED Medications - No data to display  Initial Impression / Assessment and Plan / ED Course  I have reviewed the triage vital signs and the nursing notes.  Pertinent labs & imaging results that were available during my care of the Christian Bush were reviewed by me and considered in my medical decision making (see chart for details).   Christian Bush returns to ER for SI/auditory hallucinations. 3rd ER visit in past 12 hours for same, prior 2 visits have been reviewed. Had [facility] assessment @ 02:11 AM today; note by NP Lindon Romp has been reviewed:   Recommendations: Based on my evaluation the Christian Bush does not appear to have an emergency medical condition.  No evidence of imminent risk to self or others at present.   Christian Bush does not meet criteria for psychiatric inpatient admission. Supportive therapy provided about ongoing  stressors. Discussed crisis plan, support from social network, calling 911, coming to the Emergency Department, and calling Suicide Hotline. Provided with outpatient resources  Seen in the ER shortly after & was discharged. Returned to [facility] & seen again @ 0700, note has not been completed by provider therefore I called and spoke with [facility] NP Ehrenfeld Rankin to clarify, she states Christian Bush again did not meet inpatient criteria & was discharged w/ instructions to utilize outpatient resources. Given Christian Bush was seen within past 2 hours by [facility] w/o significant change in his H&P will discharge home w/ resources @ this time. Discussed w/ supervising physician Dr. Vanita Panda who is in agreement. I discussed  treatment plan, need for follow-up, and return precautions with the Christian Bush. Provided opportunity for questions, Christian Bush confirmed understanding and is in agreement with plan.   Final Clinical Impressions(s) / ED Diagnoses   Final diagnoses:  Suicidal ideation    ED Discharge Orders    None       Amaryllis Dyke, PA-C 12/10/18 8786    Carmin Muskrat, MD 12/10/18 1552

## 2018-12-10 NOTE — Progress Notes (Signed)
   12/10/18 2210  COVID-19 Daily Checkoff  Have you had a fever (temp > 37.80C/100F)  in the past 24 hours?  No  COVID-19 EXPOSURE  Have you traveled outside the state in the past 14 days? No  Have you been in contact with someone with a confirmed diagnosis of COVID-19 or PUI in the past 14 days without wearing appropriate PPE? No  Have you been living in the same home as a person with confirmed diagnosis of COVID-19 or a PUI (household contact)? No  Have you been diagnosed with COVID-19? No

## 2018-12-10 NOTE — ED Triage Notes (Addendum)
Pt from home, having SI, hearing voices; plan to cut wrists; pt discharged from [facility] this am for same; pt calm and cooperative at this time

## 2018-12-10 NOTE — H&P (Signed)
Behavioral Health Medical Screening Exam  Christian Bush is an 23 y.o. male patient who presents as a walk in from [facility]. Patient reports that he is hearing voices "they say they want me to like cut my wrists and stuff like that." States that he started hearing the voices three to four days ago, which is not consistent with chart review. No indication that he is responding to internal stimuli. Denies an actual suicidal plan or intent. Per TTS assessment patient denied SI plan.   Patient adamantly denies that he has a psychiatric history, although a review of this chart indicates that he was seen here as a walk-in in Nov 14, 2018 and at that time indicated he was taking Seroquel. Patient has a history of giving fictitious names/birthdays. Chart is in process of being merged. Patient also denies that he was discharged from [facility] OBS unit on 12/09/2018.  States that he does not know his social security number.   Total Time spent with patient: 20 minutes  Psychiatric Specialty Exam: Physical Exam  Constitutional: He is oriented to person, place, and time. He appears well-developed and well-nourished. No distress.  HENT:  Head: Normocephalic and atraumatic.  Right Ear: External ear normal.  Left Ear: External ear normal.  Eyes: Pupils are equal, round, and reactive to light. Conjunctivae are normal. Right eye exhibits no discharge. Left eye exhibits no discharge. No scleral icterus.  Respiratory: Effort normal. No respiratory distress.  Musculoskeletal: Normal range of motion.  Neurological: He is alert and oriented to person, place, and time.  Skin: He is not diaphoretic.  Psychiatric: He is not withdrawn and not actively hallucinating. Thought content is not paranoid and not delusional. He expresses no homicidal and no suicidal ideation.    Review of Systems  Constitutional: Negative for chills, diaphoresis, fever, malaise/fatigue and weight loss.  Respiratory: Negative for cough and shortness of  breath.   Cardiovascular: Negative for chest pain.  Gastrointestinal: Negative for diarrhea, nausea and vomiting.  Psychiatric/Behavioral: Positive for depression, hallucinations and suicidal ideas. Negative for memory loss and substance abuse. The patient is nervous/anxious and has insomnia.     Blood pressure 139/71, pulse 87, temperature 98.3 F (36.8 C), temperature source Oral, resp. rate 16, SpO2 98 %.There is no height or weight on file to calculate BMI.  General Appearance: Casual and Fairly Groomed  Eye Contact:  Good  Speech:  Clear and Coherent and Normal Rate  Volume:  Normal  Mood:  Depressed  Affect:  Congruent and Depressed  Thought Process:  Coherent and Descriptions of Associations: Intact  Orientation:  Full (Time, Place, and Person)  Thought Content:  Hallucinations: Auditory Reprots auditory hallucinations.  Suicidal Thoughts:  Yes.  without intent/plan  Homicidal Thoughts:  No  Memory:  Immediate;   Fair Recent;   Fair  Judgement:  Fair  Insight:  Fair  Psychomotor Activity:  Normal  Concentration: Concentration: Good and Attention Span: Good  Recall:  [place] of Knowledge:Good  Language: Good  Akathisia:  Negative  Handed:  Right  AIMS (if indicated):     Assets:  Communication Skills Leisure Time Physical Health  Sleep:       Musculoskeletal: Strength & Muscle Tone: within normal limits Gait & Station: normal Patient leans: Front  Blood pressure 139/71, pulse 87, temperature 98.3 F (36.8 C), temperature source Oral, resp. rate 16, SpO2 98 %.  Recommendations:  Based on my evaluation the patient does not appear to have an emergency medical condition.  No  evidence of imminent risk to self or others at present.   Patient does not meet criteria for psychiatric inpatient admission. Supportive therapy provided about ongoing stressors. Discussed crisis plan, support from social network, calling 911, coming to the Emergency Department, and calling  Suicide Hotline. Provided with outpatient resources  Christian PolingJason A Atticus Lemberger, NP 12/10/2018, 2:40 AM

## 2018-12-10 NOTE — Progress Notes (Signed)
Christian Bush is a 23 year old male being admitted voluntarily to [facility] Obs unit room 202.  He came in as a walk in reporting SI, HI (no specific person) and auditory hallucinations.  During OBS unit admission, he was pleasant but guarded.  He continued to voice SI/HI with no specific plan and verbally agrees to not harm self on the unit.  Oriented him to the unit.  BH-OBS paperwork completed and signed.  Belongings secured in tamper resistant bag and placed in locker # 20.  No contraband found.  Skin assessment completed and no skin issues noted.  Q 15 minute checks initiated for safety.

## 2018-12-10 NOTE — ED Notes (Addendum)
Pt refused to sign for discharge 

## 2018-12-10 NOTE — Plan of Care (Signed)
[facility] Crisis Plan  Reason for Crisis Plan:  Chronic Mental Illness/Medical Illness and Crisis Stabilization   Plan of Care:  Referral for Telepsychiatry/Psychiatric Consult  Family Support:      Current Living Environment:  Living Arrangements: Alone  Insurance:   Hospital Account    Name Acct ID Class Status Primary Coverage   Christian Bush, Christian Bush [id number] [place] [facility]/[place]        Guarantor Account (for Hospital Account [id number])    Name Relation to [place]? Acct Type   Christian Bush Do[place]   Address Phone       homeless [place], [place] [address] [phone](H)          Coverage Information (for Hospital Account [id number])    F/O Payor/Plan Precert #   [place] [facility]/SAS/[facility]    Subscriber Subscriber #   Christian Bush, Christian Bush [id number]   Address Phone   [address] [place], [place] [address] [phone]      Legal Guardian:     Primary Care Provider:  Patient, No Pcp Per  Current Outpatient Providers:  Christian Bush  Psychiatrist:     Counselor/Therapist:     Compliant with Medications:  No  Additional Information:   Christian Bush 6/16/202011:43 PM

## 2018-12-11 ENCOUNTER — Encounter (HOSPITAL_COMMUNITY): Payer: Self-pay | Admitting: Registered Nurse

## 2018-12-11 DIAGNOSIS — F4325 Adjustment disorder with mixed disturbance of emotions and conduct: Secondary | ICD-10-CM

## 2018-12-11 NOTE — BH Assessment (Addendum)
Assessment Note  Christian BlossomJeffrey Gravley is an 23 y.o. male [facility] and HI with plan to cut himself and other people with a knife. Patient reported he did not own a knife stating "I can go to the store and buy one". Patient reported once he left today he became increasingly suicidal and homicidal because of life and that he was unable to take care of himself. Patient last seen 12/10/18 at 12:22am, TTS assessment was completed. Clinician reviewed prior assessment, patient agreed that the only changes were information listed above regarding SI and HI with plan. Patient was cooperative during assessment.  PER TTS ASSESSMENT on 12/10/18 at 12:22am: Christian Bush is a 23 y.o. male who [facility] to be assessed in-person by Nira ConnJason Berry, NP due to conflicting information he provided his nurse and the clinician who [facility]. Pt told his nurse that he had a plan to cut his wrists and later denied he had a plan, or weapons/guns, to this clinician when these questions were posed. During the initial [facility], pt stated he was experiencing AH that were telling him to kill himself and that he had never experienced AH before or SI before. Pt denied having a plan, that he had ever attempted to kill himself before, or that he had ever been hospitalized for MH reasons in the past. Pt denied VH. Pt denied HI. Pt then stated that he was experiencing VH. Clinician requested pt share what he was experiencing and pt stated "spirits." Clinician inquired as to how pt knew that it was spirits that he was seeing and pt stated "that's what they seem like." Pt denied NSSIB, access to guns and weapons, involvement with the law, and use of substances (pt's UDA came back clean/negative from the use of EtOH or substances). Pt shares he is currently homeless. Records show [facility] after an overnight stay  under a different name, which was documented by Dr. Juanetta BeetsJacqueline Norman, DO, who knows pt under both of the names he has used. When pt was asked about his [facility] last night, pt stated that he doesn't remember. Clinician read the notes from pt's assessment last night and he stated he was going to kill himself in a different manner, which was via walking into traffic.  Pt states he has no family or close friends for clinician to contact for collateral.  Diagnosis: Major depressive disorder  Past Medical History: History reviewed. No pertinent past medical history.  History reviewed. No pertinent surgical history.  Family History: History reviewed. No pertinent family history.  Social History:  reports that he has never smoked. He has never used smokeless tobacco. He reports previous alcohol use. He reports previous drug use.  Additional Social History:  Alcohol / Drug Use Pain Medications: see MAR Prescriptions: see MAR Over the Counter: see MAR  CIWA: CIWA-Ar BP: 110/87 Pulse Rate: 78 COWS:    Allergies: No Known Allergies  Home Medications:  No medications prior to admission.    OB/GYN Status:  No LMP for male patient.  General Assessment Data [facility] TTS Assessment: In system Is this a Tele or Face-to-Face Assessment?: Tele Assessment Is this an Initial Assessment or a Re-assessment for this encounter?: Initial Assessment Patient Accompanied by:: N/A Language Other than English: No Living Arrangements: Homeless/Shelter What gender do you identify as?:  Male Marital status: Single Living Arrangements: Alone Can pt return to current living arrangement?: Yes Admission Status: Voluntary Is patient capable of signing voluntary admission?: Yes Referral Source: Self/Family/Friend     Crisis Care Plan Living Arrangements: Alone Legal Guardian: (self) Name of Psychiatrist: None Name of Therapist: None  Education Status Is  patient currently in school?: No Is the patient employed, unemployed or receiving disability?: Unemployed  Risk to self with the past 6 months Suicidal Ideation: Yes-Currently Present Has patient been a risk to self within the past 6 months prior to admission? : Yes Suicidal Intent: Yes-Currently Present Has patient had any suicidal intent within the past 6 months prior to admission? : Yes Is patient at risk for suicide?: Yes Suicidal Plan?: Yes-Currently Present Has patient had any suicidal plan within the past 6 months prior to admission? : Yes Specify Current Suicidal Plan: (stab self with knife) Access to Means: No What has been your use of drugs/alcohol within the last 12 months?: (denied) Previous Attempts/Gestures: (unknown) Triggers for Past Attempts: None known Intentional Self Injurious Behavior: None Family Suicide History: No Recent stressful life event(s): Financial Problems(hallucinations ) Persecutory voices/beliefs?: No Depression: Yes Depression Symptoms: Feeling worthless/self pity, Loss of interest in usual pleasures, Guilt, Fatigue, Isolating, Tearfulness Substance abuse history and/or treatment for substance abuse?: No Suicide prevention information given to non-admitted patients: Not applicable  Risk to Others within the past 6 months Homicidal Ideation: Yes-Currently Present Does patient have any lifetime risk of violence toward others beyond the six months prior to admission? : Yes (comment) Thoughts of Harm to Others: Yes-Currently Present Comment - Thoughts of Harm to Others: (cut people with knife) Current Homicidal Intent: Yes-Currently Present Current Homicidal Plan: Yes-Currently Present Describe Current Homicidal Plan: (cut people with knife) Access to Homicidal Means: Yes Describe Access to Homicidal Means: (purchase knife at a store) Identified Victim: (none) History of harm to others?: No Assessment of Violence: None Noted Violent Behavior  Description: (none reported) Does patient have access to weapons?: No Criminal Charges Pending?: No Does patient have a court date: No Is patient on probation?: No  Psychosis Hallucinations: Auditory, Visual Delusions: None noted  Mental Status Report Appearance/Hygiene: Body odor, Disheveled Eye Contact: Poor Motor Activity: Freedom of movement Speech: Soft, Slow Level of Consciousness: Quiet/awake Mood: Depressed, Preoccupied Affect: Flat Anxiety Level: Minimal Thought Processes: Coherent, Relevant Judgement: Partial Orientation: Person, Place, Time, Situation Obsessive Compulsive Thoughts/Behaviors: None  Cognitive Functioning Concentration: Fair Memory: Recent Intact Is patient IDD: No Insight: Poor Impulse Control: Poor Appetite: Fair Have you had any weight changes? : No Change Sleep: No Change Total Hours of Sleep: (5) Vegetative Symptoms: None  ADLScreening [facility] Assessment Services) Patient's cognitive ability adequate to safely complete daily activities?: Yes Patient able to express need for assistance with ADLs?: Yes Independently performs ADLs?: Yes (appropriate for developmental age)  Prior Inpatient Therapy Prior Inpatient Therapy: No  Prior Outpatient Therapy Prior Outpatient Therapy: No Does patient have an ACCT team?: No Does patient have Intensive In-House Services?  : No Does patient have [facility] services? : No Does patient have P4CC services?: No  ADL Screening (condition at time of admission) Patient's cognitive ability adequate to safely complete daily activities?: Yes Is the patient deaf or have difficulty hearing?: No Does the patient have difficulty seeing, even when wearing glasses/contacts?: No Does the patient have difficulty concentrating, remembering, or making decisions?: No Patient able to express need for assistance with ADLs?: Yes Does the patient have difficulty dressing or bathing?: No  Independently performs ADLs?: Yes  (appropriate for developmental age) Does the patient have difficulty walking or climbing stairs?: No Weakness of Legs: None Weakness of Arms/Hands: None  Home Assistive Devices/Equipment Home Assistive Devices/Equipment: None  Therapy Consults (therapy consults require a physician order) PT Evaluation Needed: No OT Evalulation Needed: No SLP Evaluation Needed: No Abuse/Neglect Assessment (Assessment to be complete while patient is alone) Abuse/Neglect Assessment Can Be Completed: Yes Physical Abuse: Denies Verbal Abuse: Denies Sexual Abuse: Denies Exploitation of patient/patient's resources: Denies Self-Neglect: Denies Values / Beliefs Cultural Requests During Hospitalization: None Spiritual Requests During Hospitalization: None Consults Spiritual Care Consult Needed: No Social Work Consult Needed: No Merchant navy officerAdvance Directives (For Healthcare) Does Patient Have a Medical Advance Directive?: No Would patient like information on creating a medical advance directive?: No - Patient declined Nutrition Screen- [facility] Adult/WL/AP Patient's home diet: Regular Has the patient recently lost weight without trying?: No Has the patient been eating poorly because of a decreased appetite?: No Malnutrition Screening Tool Score: 0   Disposition:  Disposition Initial Assessment Completed for this Encounter: Yes  Nira ConnJason Berry, NP, recommends overnight observation. [facility] and [facility] informed of disposition.  On Site Evaluation by:   Reviewed with Physician:    Burnetta SabinLatisha D Geoffrey Hynes 12/11/2018 2:35 AM

## 2018-12-11 NOTE — Discharge Instructions (Signed)
For your mental health needs, you are advised to follow up with [facility].  Call them at your earliest opportunity to schedule an intake appointment: ° °     [facility] °     [address] °     [place], [place] [address] °     [phone] °     Crisis number: [phone] °

## 2018-12-11 NOTE — Progress Notes (Signed)
Patient ID: Christian Bush, male   DOB: 14-Dec-1995, 23 y.o.   MRN: [id number] Patient discharged to home/self care on his own accord.  Patient given shelter resources upon discharge.  Patient currently denies SI, HI and AVH.

## 2018-12-11 NOTE — H&P (Addendum)
[facility] Provider Admission PAA/H&P  Patient Identification: Christian Bush MRN:  [id number] Date of Evaluation:  12/11/2018 Chief Complaint:  mdd Principal Diagnosis: Schizoaffective disorder ([place]) Diagnosis:  Active Problems:   Schizoaffective disorder ([place])  History of Present Illness:   Patient presents to [facility] as a walk-in. This is the third time that he has been assessed by this writer in less than 18 hours. Patient has also visited the emergency department three times for the same. See below notes.  Patient now reports that he is homicidal towards the public with thoughts of stabbing people with a blade. Denies that he has a blade, but states that he can buy one. Assessment otherwise unchanged. Patient is most likely malingering, but we will observe overnight.   12/10/18 0300 Note: Christian Bush an 23 y.o.malepatient who presents as a walk in from [facility]. Patient reports that he is hearing voices "they say they want me to like cut my wrists and stuff like that." States that he started hearing the voices three to four days ago, which is not consistent with chart review. No indication that he is responding to internal stimuli. Denies an actual suicidal plan or intent. Per TTS assessment patient denied SI plan.   Patient adamantly denies that he has a psychiatric history, although a review of this chart indicates that he was seen here as a walk-in in Nov 14, 2018 and at that time indicated he was taking Seroquel. Patient has a history of giving fictitious names/birthdays. Chart is in process of being merged. Patient also denies that he was discharged from [facility] OBS unit on 12/09/2018. States that he does not know his social security number.    Associated Signs/Symptoms: Depression Symptoms:  depressed mood, insomnia, psychomotor agitation, feelings of worthlessness/guilt, hopelessness, suicidal thoughts without plan, (Hypo) Manic Symptoms:  Hallucinations, Irritable  Mood, Labiality of Mood, Anxiety Symptoms:  denies Psychotic Symptoms:  Hallucinations: Auditory Command:  voices tell him to kill himself PTSD Symptoms: Negative Total Time spent with patient: 30 minutes  Past Psychiatric History:   Is the patient at risk to self? Yes.    Has the patient been a risk to self in the past 6 months? Yes.    Has the patient been a risk to self within the distant past? Yes.    Is the patient a risk to others? Yes.    Has the patient been a risk to others in the past 6 months? Yes.    Has the patient been a risk to others within the distant past? Yes.     Prior Inpatient Therapy: Prior Inpatient Therapy: No Prior Outpatient Therapy: Prior Outpatient Therapy: No Does patient have an ACCT team?: No Does patient have Intensive In-House Services?  : No Does patient have [facility] services? : No Does patient have P4CC services?: No  Alcohol Screening: 1. How often do you have a drink containing alcohol?: Never 2. How many drinks containing alcohol do you have on a typical day when you are drinking?: 1 or 2 3. How often do you have six or more drinks on one occasion?: Never AUDIT-C Score: 0 9. Have you or someone else been injured as a result of your drinking?: No 10. Has a relative or friend or a doctor or another health worker been concerned about your drinking or suggested you cut down?: No Alcohol Use Disorder Identification Test Final Score (AUDIT): 0 Alcohol Brief Interventions/Follow-up: Patient Refused Substance Abuse History in the last 12 months:  Yes.  Consequences of Substance Abuse: Negative Previous Psychotropic Medications: Yes  Psychological Evaluations: Yes  Past Medical History: History reviewed. No pertinent past medical history. History reviewed. No pertinent surgical history. Family History: History reviewed. No pertinent family history. Family Psychiatric History: unknown Tobacco Screening: Have you used any form of tobacco in the  last 30 days? (Cigarettes, Smokeless Tobacco, Cigars, and/or Pipes): No Social History:  Social History   Substance and Sexual Activity  Alcohol Use Not Currently     Social History   Substance and Sexual Activity  Drug Use Not Currently    Additional Social History: Marital status: Single    Pain Medications: see MAR Prescriptions: see MAR Over the Counter: see MAR                    Allergies:  No Known Allergies Lab Results:  Results for orders placed or performed during the hospital encounter of 12/09/18 (from the past 48 hour(s))  Rapid urine drug screen (hospital performed)     Status: None   Collection Time: 12/09/18  6:23 PM  Result Value Ref Range   Opiates NONE DETECTED NONE DETECTED   Cocaine NONE DETECTED NONE DETECTED   Benzodiazepines NONE DETECTED NONE DETECTED   Amphetamines NONE DETECTED NONE DETECTED   Tetrahydrocannabinol NONE DETECTED NONE DETECTED   Barbiturates NONE DETECTED NONE DETECTED    Comment: (NOTE) DRUG SCREEN FOR MEDICAL PURPOSES ONLY.  IF CONFIRMATION IS NEEDED FOR ANY PURPOSE, NOTIFY LAB WITHIN 5 DAYS. LOWEST DETECTABLE LIMITS FOR URINE DRUG SCREEN Drug Class                     Cutoff (ng/mL) Amphetamine and metabolites    1000 Barbiturate and metabolites    200 Benzodiazepine                 200 Tricyclics and metabolites     300 Opiates and metabolites        300 Cocaine and metabolites        300 THC                            50 Performed at [facility], [address] [place], [place] [address]   Comprehensive metabolic panel     Status: Abnormal   Collection Time: 12/09/18  6:33 PM  Result Value Ref Range   Sodium 136 135 - 145 mmol/L   Potassium 3.7 3.5 - 5.1 mmol/L   Chloride 106 98 - 111 mmol/L   CO2 22 22 - 32 mmol/L   Glucose, Bld 90 70 - 99 mg/dL   BUN 18 6 - 20 mg/dL   Creatinine, Ser 6.041.01 0.61 - 1.24 mg/dL   Calcium 9.1 8.9 - 54.010.3 mg/dL   Total Protein 8.4 (H) 6.5 - 8.1 g/dL    Albumin 4.4 3.5 - 5.0 g/dL   AST 25 15 - 41 U/L   ALT 42 0 - 44 U/L   Alkaline Phosphatase 78 38 - 126 U/L   Total Bilirubin 0.3 0.3 - 1.2 mg/dL   GFR calc non Af Amer >60 >60 mL/min   GFR calc Af Amer >60 >60 mL/min   Anion gap 8 5 - 15    Comment: Performed at [facility], [address] [place], [place] [address]  Ethanol     Status: None   Collection Time: 12/09/18  6:33 PM  Result Value Ref Range  Alcohol, Ethyl (B) <10 <10 mg/dL    Comment: (NOTE) Lowest detectable limit for serum alcohol is 10 mg/dL. For medical purposes only. Performed at  [facility], [address] [place], [place] [address]   Salicylate level     Status: None   Collection Time: 12/09/18  6:33 PM  Result Value Ref Range   Salicylate Lvl <7.0 2.8 - 30.0 mg/dL    Comment: Performed at [facility], [address] [place], [place] [address]  Acetaminophen level     Status: Abnormal   Collection Time: 12/09/18  6:33 PM  Result Value Ref Range   Acetaminophen (Tylenol), Serum <10 (L) 10 - 30 ug/mL    Comment: (NOTE) Therapeutic concentrations vary significantly. A range of 10-30 ug/mL  may be an effective concentration for many patients. However, some  are best treated at concentrations outside of this range. Acetaminophen concentrations >150 ug/mL at 4 hours after ingestion  and >50 ug/mL at 12 hours after ingestion are often associated with  toxic reactions. Performed at [facility], [address] [place], [place] [address]   cbc     Status: Abnormal   Collection Time: 12/09/18  6:33 PM  Result Value Ref Range   WBC 12.9 (H) 4.0 - 10.5 K/uL   RBC 5.68 4.22 - 5.81 MIL/uL   Hemoglobin 15.8 13.0 - 17.0 g/dL   HCT 91.450.0 78.239.0 - 95.652.0 %   MCV 88.0 80.0 - 100.0 fL   MCH 27.8 26.0 - 34.0 pg   MCHC 31.6 30.0 - 36.0 g/dL   RDW 21.313.4 08.611.5 - 57.815.5 %   Platelets 300 150 - 400 K/uL   nRBC 0.0 0.0 - 0.2 %    Comment: Performed  at [facility], [address] [place], [place] [address]    Blood Alcohol level:  Lab Results  Component Value Date   ETH <10 12/09/2018    Metabolic Disorder Labs:  No results found for: HGBA1C, MPG No results found for: PROLACTIN No results found for: CHOL, TRIG, HDL, CHOLHDL, VLDL, LDLCALC  Current Medications: Current Facility-Administered Medications  Medication Dose Route Frequency Provider Last Rate Last Dose  . acetaminophen (TYLENOL) tablet 650 mg  650 mg Oral Q6H PRN Jackelyn PolingBerry, Jason A, NP      . alum & mag hydroxide-simeth (MAALOX/MYLANTA) 200-200-20 MG/5ML suspension 30 mL  30 mL Oral Q4H PRN Nira ConnBerry, Jason A, NP      . hydrOXYzine (ATARAX/VISTARIL) tablet 25 mg  25 mg Oral TID PRN Nira ConnBerry, Jason A, NP      . magnesium hydroxide (MILK OF MAGNESIA) suspension 30 mL  30 mL Oral Daily PRN Nira ConnBerry, Jason A, NP      . QUEtiapine (SEROQUEL) tablet 300 mg  300 mg Oral QHS Nira ConnBerry, Jason A, NP   300 mg at 12/11/18 0135   PTA Medications: No medications prior to admission.    Musculoskeletal: Strength & Muscle Tone: within normal limits Gait & Station: normal  Psychiatric Specialty Exam: Physical Exam  Constitutional: He is oriented to person, place, and time. He appears well-developed and well-nourished. No distress.  HENT:  Head: Normocephalic and atraumatic.  Right Ear: External ear normal.  Left Ear: External ear normal.  Eyes: Pupils are equal, round, and reactive to light. Conjunctivae are normal. Right eye exhibits no discharge. Left eye exhibits no discharge. No scleral icterus.  Respiratory: Effort normal. No respiratory distress.  Musculoskeletal: Normal range of motion.  Neurological: He is alert  and oriented to person, place, and time.  Skin: He is not diaphoretic.  Psychiatric: His speech is normal. He is not withdrawn and not actively hallucinating. Thought content is not paranoid and not delusional. He expresses impulsivity and inappropriate  judgment. He exhibits a depressed mood. He expresses homicidal and suicidal ideation. He expresses homicidal plans. He expresses no suicidal plans.    Review of Systems  Constitutional: Negative for chills, diaphoresis, fever, malaise/fatigue and weight loss.  Respiratory: Negative for cough and shortness of breath.   Cardiovascular: Negative for chest pain.  Gastrointestinal: Negative for diarrhea, nausea and vomiting.  Psychiatric/Behavioral: Positive for depression, hallucinations and suicidal ideas. Negative for memory loss and substance abuse. The patient is nervous/anxious and has insomnia.     Blood pressure 110/87, pulse 78, temperature 98.1 F (36.7 C), temperature source Oral, resp. rate 18, SpO2 98 %.There is no height or weight on file to calculate BMI.  General Appearance: Casual and Fairly Groomed  Eye Contact:  Fair  Speech:  Clear and Coherent and Normal Rate  Volume:  Normal  Mood:  Depressed  Affect:  Congruent and Depressed  Thought Process:  Coherent and Descriptions of Associations: Intact  Orientation:  Full (Time, Place, and Person)  Thought Content:  Logical and Hallucinations: Auditory  Suicidal Thoughts:  Yes.  without intent/plan  Homicidal Thoughts:  Yes.  with intent/plan  Memory:  Immediate;   Fair Recent;   Fair  Judgement:  Fair  Insight:  Fair  Psychomotor Activity:  Normal  Concentration: Concentration: Fair and Attention Span: Fair  Recall:  Good  Fund of Knowledge:Good  Language: Negative  Akathisia:  Negative  Handed:  Right  AIMS (if indicated):     Assets:  Communication Skills Leisure Time Physical Health  Sleep:          Treatment Plan Summary: Daily contact with patient to assess and evaluate symptoms and progress in treatment and Medication management  Observation Level/Precautions:  15 minute checks Laboratory:  Deferred Psychotherapy:  inidividual Medications:  Seroquel 300 mg QHS Consultations:  Social work Discharge  Concerns:  Homeless Estimated LOS:<24 hours Other:      Jackelyn PolingJason A Berry, NP 6/17/20202:51 AM   Patient's chart reviewed. Reviewed the information documented and agree with the treatment plan.  Juanetta BeetsJacqueline Teal Raben, DO 12/11/18 4:53 PM

## 2018-12-11 NOTE — Discharge Summary (Addendum)
[facility] ED Discharge  12/11/2018 10:53 AM Christian Bush  MRN:  [id number] Principal Problem: Adjustment disorder with mixed disturbance of emotions and conduct Discharge Diagnoses: Principal Problem:   Adjustment disorder with mixed disturbance of emotions and conduct Active Problems:   Schizoaffective disorder ([place])   Subjective: Patient reported that he came to the hospital because he was having thoughts of wanting to harm the public "I thought I would stab someone"   Patient seen face to face by this provider and Dr. Mariea Clonts; chart reviewed on 12/11/18.  On evaluation Christian Bush reports that he has been hearing voices telling him to hurt other people.  Patient asked his name and he responded Christian Bush; patient asked if he was here in the hospital on Monday as Christian Bush and he responded "No"; but during the assessment when would ask patient about things that occurred on Monday related to his visit he would state "No" or "I don't know"  Patient states that he is not taking any medications and that he does not have outpatient services.  Patient states that he is homeless and needs a place to stay.  Patient denies prior suicide attempt and history of violence.  Patient states that he does not own a knife or have access to one.  Patient informed that it was hard to provide help if he was not truthful with who he was; patient also informed that it was illegal to make threats against others and that we as providers had a duty to warn; and that it was also against the law to misuse emergency services.  Patient then states that the thoughts are not his own and that he wouldn't hurt anyone.  Dr. Mariea Clonts informed patient that she was the provider who saw him on Monday using a different name Christian Bush) making the same complaints.  Patient states that it was not him.  Patient was also addressed Monday related to presenting using false name. Patient denies alcohol and drug use.  Patient states that  he has never been given any referral information in the past then states "I don't remember."     During evaluation Christian Bush is laying on his side away from provider; patient asked if he could turn to face provider for assessment interview; patient stated "Can't you just walk around.  Patient remained laying down facing away; Provider walked to other side to face patient.  Patient presented as alert/oriented x 4; calm/cooperative; and mood congruent with affect.  Patient is speaking in a clear tone at moderate volume, and normal pace; with good eye contact.  His thought process is coherent and relevant; There is no indication that he is currently responding to internal/external stimuli or experiencing delusional thought content; but states that he is hearing voices.  Patient denies homicidal ideation,and paranoia; stating that he is not going to hurt anyone.  Patient has remained calm throughout assessment.  It appears that patient malingering for secondary gain related to homelessness and change in the recent weather with drop in temperature and heavy rains.   Patient given resources for housing, community resource, outpatient psychiatric services   Total Time spent with patient: 30 minutes  Past Psychiatric History: Patient reports he doesn't know if he has ever had inpatient or outpatient psychiatric services.    Past Medical History: History reviewed. No pertinent past medical history. History reviewed. No pertinent surgical history. Family History: History reviewed. No pertinent family history. Family Psychiatric  History: Denies Social History:  Social History  Substance and Sexual Activity  Alcohol Use Not Currently     Social History   Substance and Sexual Activity  Drug Use Not Currently    Social History   Socioeconomic History  . Marital status: Single    Spouse name: Not on file  . Number of children: Not on file  . Years of education: Not on file  . Highest education  level: Not on file  Occupational History  . Not on file  Social Needs  . Financial resource strain: Not on file  . Food insecurity    Worry: Not on file    Inability: Not on file  . Transportation needs    Medical: Not on file    Non-medical: Not on file  Tobacco Use  . Smoking status: Never Smoker  . Smokeless tobacco: Never Used  Substance and Sexual Activity  . Alcohol use: Not Currently  . Drug use: Not Currently  . Sexual activity: Not on file  Lifestyle  . Physical activity    Days per week: Not on file    Minutes per session: Not on file  . Stress: Not on file  Relationships  . Social Musicianconnections    Talks on phone: Not on file    Gets together: Not on file    Attends religious service: Not on file    Active member of club or organization: Not on file    Attends meetings of clubs or organizations: Not on file    Relationship status: Not on file  Other Topics Concern  . Not on file  Social History Narrative  . Not on file    Has this patient used any form of tobacco in the last 30 days? (Cigarettes, Smokeless Tobacco, Cigars, and/or Pipes) Prescription not provided because: Reporting he does not smoke  Current Medications: Current Facility-Administered Medications  Medication Dose Route Frequency Provider Last Rate Last Dose  . acetaminophen (TYLENOL) tablet 650 mg  650 mg Oral Q6H PRN Jackelyn PolingBerry, Jason A, NP      . alum & mag hydroxide-simeth (MAALOX/MYLANTA) 200-200-20 MG/5ML suspension 30 mL  30 mL Oral Q4H PRN Nira ConnBerry, Jason A, NP      . hydrOXYzine (ATARAX/VISTARIL) tablet 25 mg  25 mg Oral TID PRN Nira ConnBerry, Jason A, NP      . magnesium hydroxide (MILK OF MAGNESIA) suspension 30 mL  30 mL Oral Daily PRN Nira ConnBerry, Jason A, NP      . QUEtiapine (SEROQUEL) tablet 300 mg  300 mg Oral QHS Nira ConnBerry, Jason A, NP   300 mg at 12/11/18 0135   PTA Medications: No medications prior to admission.    Musculoskeletal: Strength & Muscle Tone: within normal limits Gait & Station:  normal Patient leans: N/A  Psychiatric Specialty Exam: Physical Exam  Nursing note and vitals reviewed. Constitutional: He is oriented to person, place, and time. He appears well-developed and well-nourished.  HENT:  Head: Normocephalic and atraumatic.  Neck: Normal range of motion.  Respiratory: Effort normal.  Musculoskeletal: Normal range of motion.  Neurological: He is alert and oriented to person, place, and time.  Psychiatric: He has a normal mood and affect. His speech is normal and behavior is normal. Judgment and thought content normal. Cognition and memory are normal.    Review of Systems  Psychiatric/Behavioral: Negative for substance abuse.  All other systems reviewed and are negative.   Blood pressure 110/87, pulse 78, temperature 98.1 F (36.7 C), temperature source Oral, resp. rate 18, SpO2 98 %.There is no  height or weight on file to calculate BMI.  General Appearance: Casual  Eye Contact:  Good  Speech:  Clear and Coherent and Normal Rate  Volume:  Normal  Mood:  Appropriate  Affect:  Appropriate and Congruent  Thought Process:  Coherent  Orientation:  Full (Time, Place, and Person)  Thought Content:  WDL Reporting auditory hallucinations; but does not present or appear to be responding to internal or external stimuli; coherent and able to respond appropriately to questioning and discussion  Suicidal Thoughts:  No  Homicidal Thoughts:  Reports that he would not hurt anyone  Memory:  Unable to assess memory related to false statements or answering "I don't know"   Judgement:  Poor  Insight:  Fair  Psychomotor Activity:  Normal  Concentration:  Concentration: Good  Recall:  Unable to assess memory related to false statements or answering "I don't know"   Fund of Knowledge:  Fair  Language:  Fair  Akathisia:  No  Handed:  Right  AIMS (if indicated):   N/A  Assets:  Manufacturing systems engineerCommunication Skills Physical Health  ADL's:  Intact  Cognition:  WNL  Sleep:   N/A      Demographic Factors:  Male  Loss Factors: Homelessness  Historical Factors: NA  Risk Reduction Factors:   Religious beliefs about death  Continued Clinical Symptoms:  Noncompliant with recommended outpatient psychiatric services  Cognitive Features That Contribute To Risk:  Closed-mindedness    Suicide Risk:  Minimal: No identifiable suicidal ideation.  Patients presenting with no risk factors but with morbid ruminations; may be classified as minimal risk based on the severity of the depressive symptoms    Plan Of Care/Follow-up recommendations:  Activity:  As tolerated Diet:  Heart healthy Other:  Follow up with resources given  Disposition: No evidence of imminent risk to self or others at present.   Patient does not meet criteria for psychiatric inpatient admission. Supportive therapy provided about ongoing stressors. Discussed crisis plan, support from social network, calling 911, coming to the Emergency Department, and calling Suicide Hotline.  Shuvon Rankin, NP 12/11/2018, 10:53 AM     Patient seen face-to-face for psychiatric evaluation, chart reviewed and case discussed with the physician extender and developed treatment plan. Reviewed the information documented and agree with the treatment plan.   Of note, officer on duty was informed of patient communicating vague threats with no specific target and spoke to patient regarding legal ramifications.    Juanetta BeetsJacqueline Taleigh Gero, DO 12/11/18 5:03 PM

## 2018-12-11 NOTE — BH Assessment (Addendum)
[facility] Assessment Progress Note  Per Buford Dresser, DO, this pt does not require psychiatric hospitalization at this time.  Pt is to be discharged from the [facility] Observation Unit with recommendation to follow up with [facility].  This has been included in pt's discharge instructions.  Pt's nurse, Edd Arbour, has been notified.  Jalene Mullet, [facility] Triage Specialist [phone]

## 2020-06-30 IMAGING — DX PORTABLE CHEST - 1 VIEW
1 series · 1 of 1 positions shown · non-contrast
Comparison: None.

CLINICAL DATA: Fever and shortness of breath

EXAM:
PORTABLE CHEST 1 VIEW

[chest ap]
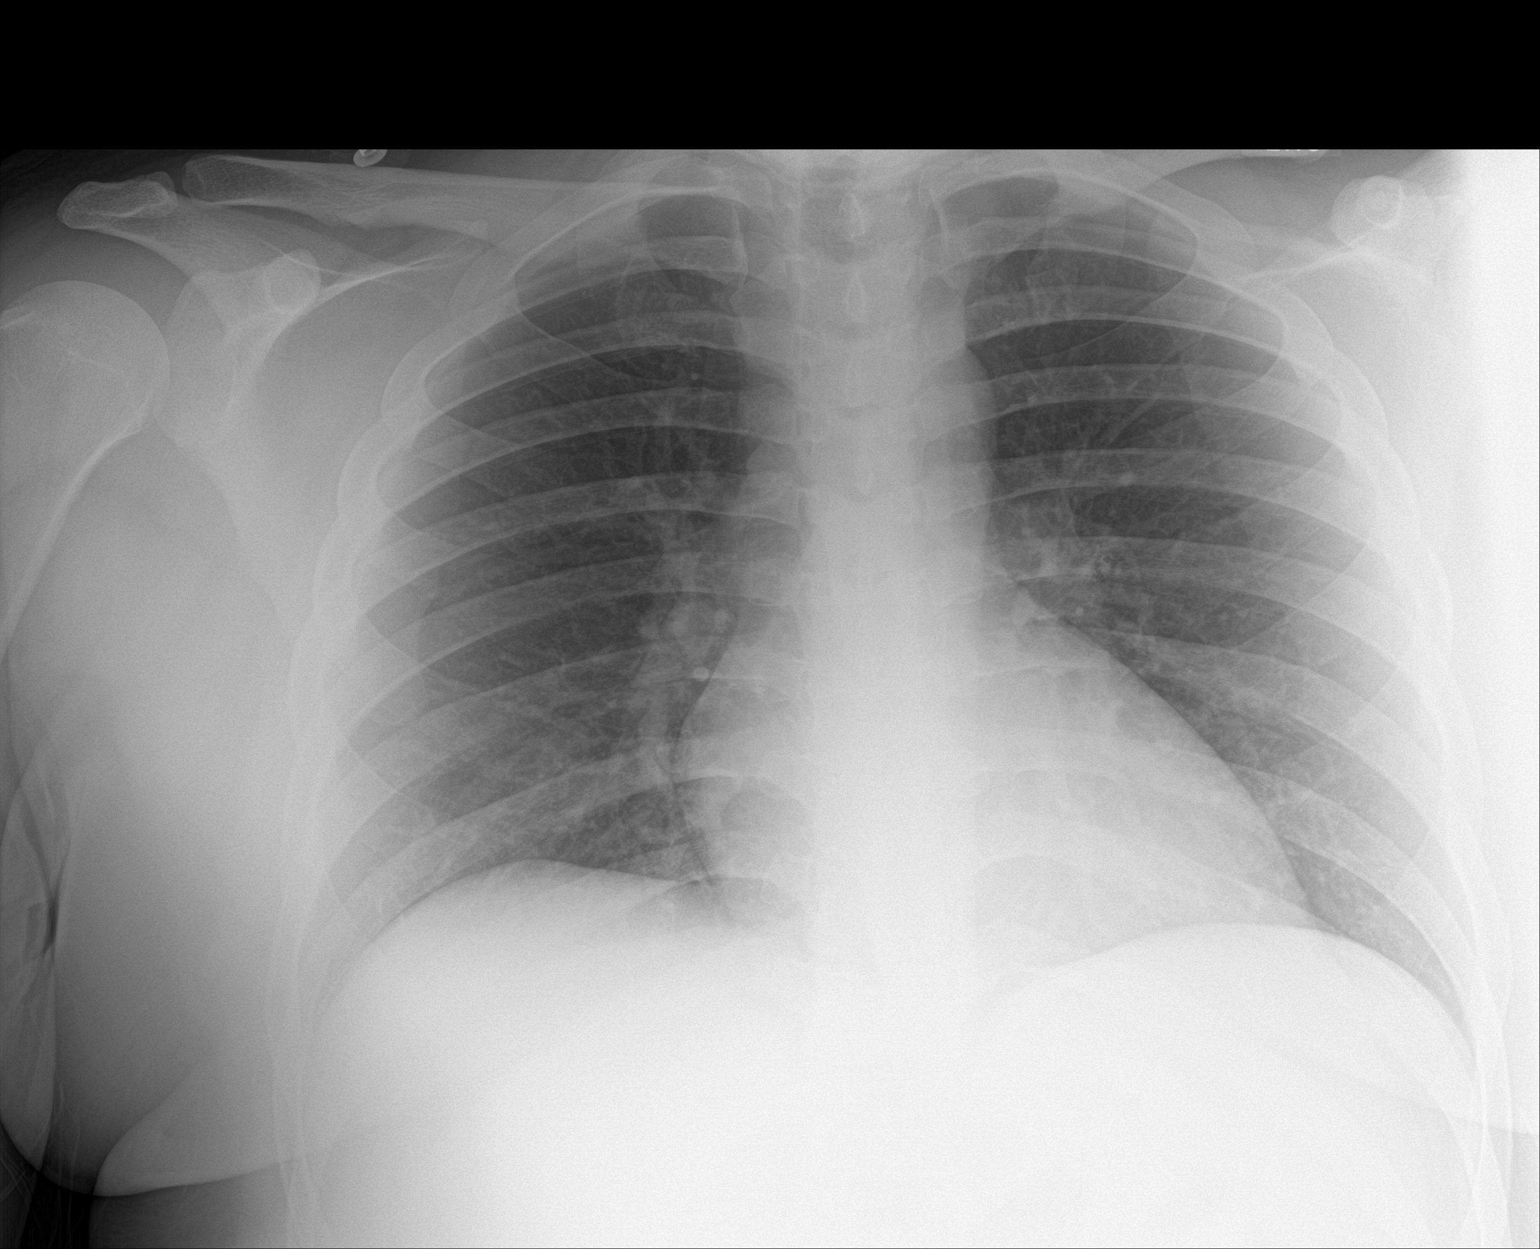

[1 of 1 positions shown; findings below may reference images not displayed]

FINDINGS: The heart size and mediastinal contours are within normal limits.
Both lungs are clear. The visualized skeletal structures are
unremarkable.
IMPRESSION: No active disease.
